# Patient Record
Sex: Male | Born: 1960 | Race: Black or African American | Hispanic: No | Marital: Single | Smoking: Current every day smoker
Health system: Southern US, Community
[De-identification: ages and names within clinical notes are randomized; demographics above are authoritative.]

## PROBLEM LIST (undated history)

## (undated) ENCOUNTER — Emergency Department (HOSPITAL_COMMUNITY): Admission: EM | Payer: Self-pay | Source: Home / Self Care

## (undated) ENCOUNTER — Ambulatory Visit (HOSPITAL_COMMUNITY): Admission: EM

## (undated) DIAGNOSIS — K219 Gastro-esophageal reflux disease without esophagitis: Secondary | ICD-10-CM

## (undated) DIAGNOSIS — I10 Essential (primary) hypertension: Secondary | ICD-10-CM

## (undated) HISTORY — PX: HERNIA REPAIR: SHX51

## (undated) HISTORY — PX: TOTAL SHOULDER REPLACEMENT: SUR1217

---

## 2015-10-01 DIAGNOSIS — M75120 Complete rotator cuff tear or rupture of unspecified shoulder, not specified as traumatic: Secondary | ICD-10-CM | POA: Insufficient documentation

## 2015-10-01 DIAGNOSIS — M79602 Pain in left arm: Secondary | ICD-10-CM | POA: Insufficient documentation

## 2015-10-01 DIAGNOSIS — M19019 Primary osteoarthritis, unspecified shoulder: Secondary | ICD-10-CM | POA: Insufficient documentation

## 2019-07-28 ENCOUNTER — Encounter: Payer: Self-pay | Admitting: Internal Medicine

## 2019-07-28 DIAGNOSIS — E872 Acidosis: Secondary | ICD-10-CM | POA: Insufficient documentation

## 2019-07-28 DIAGNOSIS — R9431 Abnormal electrocardiogram [ECG] [EKG]: Secondary | ICD-10-CM | POA: Insufficient documentation

## 2019-07-28 DIAGNOSIS — Z79899 Other long term (current) drug therapy: Secondary | ICD-10-CM | POA: Insufficient documentation

## 2019-07-28 DIAGNOSIS — F1910 Other psychoactive substance abuse, uncomplicated: Secondary | ICD-10-CM | POA: Diagnosis present

## 2019-07-28 DIAGNOSIS — E7220 Disorder of urea cycle metabolism, unspecified: Secondary | ICD-10-CM | POA: Insufficient documentation

## 2019-07-28 DIAGNOSIS — F1410 Cocaine abuse, uncomplicated: Secondary | ICD-10-CM | POA: Insufficient documentation

## 2019-07-28 DIAGNOSIS — Z20822 Contact with and (suspected) exposure to covid-19: Secondary | ICD-10-CM | POA: Insufficient documentation

## 2019-07-28 DIAGNOSIS — M5030 Other cervical disc degeneration, unspecified cervical region: Secondary | ICD-10-CM | POA: Insufficient documentation

## 2019-07-28 DIAGNOSIS — N200 Calculus of kidney: Secondary | ICD-10-CM | POA: Insufficient documentation

## 2019-07-28 DIAGNOSIS — Y9302 Activity, running: Secondary | ICD-10-CM | POA: Insufficient documentation

## 2019-07-28 DIAGNOSIS — R079 Chest pain, unspecified: Secondary | ICD-10-CM | POA: Insufficient documentation

## 2019-07-28 DIAGNOSIS — I10 Essential (primary) hypertension: Secondary | ICD-10-CM | POA: Insufficient documentation

## 2019-07-28 DIAGNOSIS — S065X9A Traumatic subdural hemorrhage with loss of consciousness of unspecified duration, initial encounter: Secondary | ICD-10-CM | POA: Diagnosis present

## 2019-07-28 DIAGNOSIS — I6202 Nontraumatic subacute subdural hemorrhage: Secondary | ICD-10-CM | POA: Insufficient documentation

## 2019-07-28 DIAGNOSIS — G9340 Encephalopathy, unspecified: Secondary | ICD-10-CM | POA: Diagnosis present

## 2019-07-28 DIAGNOSIS — G92 Toxic encephalopathy: Secondary | ICD-10-CM | POA: Insufficient documentation

## 2019-07-28 DIAGNOSIS — I700 Atherosclerosis of aorta: Secondary | ICD-10-CM | POA: Diagnosis present

## 2019-07-28 DIAGNOSIS — F1010 Alcohol abuse, uncomplicated: Secondary | ICD-10-CM | POA: Insufficient documentation

## 2019-07-28 DIAGNOSIS — E860 Dehydration: Secondary | ICD-10-CM | POA: Insufficient documentation

## 2019-07-28 DIAGNOSIS — N281 Cyst of kidney, acquired: Secondary | ICD-10-CM | POA: Insufficient documentation

## 2019-08-10 DIAGNOSIS — M25562 Pain in left knee: Secondary | ICD-10-CM | POA: Insufficient documentation

## 2019-09-09 ENCOUNTER — Emergency Department (HOSPITAL_COMMUNITY): Payer: Medicaid Other

## 2019-09-09 ENCOUNTER — Other Ambulatory Visit: Payer: Self-pay

## 2019-09-09 ENCOUNTER — Emergency Department (HOSPITAL_COMMUNITY)
Admission: EM | Admit: 2019-09-09 | Discharge: 2019-09-09 | Disposition: A | Discharge status: Home / Self Care | Payer: Medicaid Other | Attending: Emergency Medicine | Admitting: Emergency Medicine

## 2019-09-09 ENCOUNTER — Encounter (HOSPITAL_COMMUNITY): Payer: Self-pay | Admitting: Emergency Medicine

## 2019-09-09 DIAGNOSIS — Y999 Unspecified external cause status: Secondary | ICD-10-CM | POA: Insufficient documentation

## 2019-09-09 DIAGNOSIS — S90412A Abrasion, left great toe, initial encounter: Secondary | ICD-10-CM

## 2019-09-09 DIAGNOSIS — Z23 Encounter for immunization: Secondary | ICD-10-CM | POA: Insufficient documentation

## 2019-09-09 DIAGNOSIS — S92414A Nondisplaced fracture of proximal phalanx of right great toe, initial encounter for closed fracture: Secondary | ICD-10-CM

## 2019-09-09 DIAGNOSIS — F17200 Nicotine dependence, unspecified, uncomplicated: Secondary | ICD-10-CM | POA: Insufficient documentation

## 2019-09-09 DIAGNOSIS — Y9389 Activity, other specified: Secondary | ICD-10-CM | POA: Insufficient documentation

## 2019-09-09 DIAGNOSIS — S90812A Abrasion, left foot, initial encounter: Secondary | ICD-10-CM | POA: Insufficient documentation

## 2019-09-09 DIAGNOSIS — R2241 Localized swelling, mass and lump, right lower limb: Secondary | ICD-10-CM | POA: Insufficient documentation

## 2019-09-09 DIAGNOSIS — S92152A Displaced avulsion fracture (chip fracture) of left talus, initial encounter for closed fracture: Secondary | ICD-10-CM

## 2019-09-09 DIAGNOSIS — Y929 Unspecified place or not applicable: Secondary | ICD-10-CM | POA: Insufficient documentation

## 2019-09-09 MED ORDER — TRAMADOL HCL 50 MG PO TABS
50.0000 mg | ORAL_TABLET | Freq: Once | ORAL | Status: AC
  Administered 2019-09-09: 50 mg via ORAL
  Filled 2019-09-09: qty 1

## 2019-09-09 MED ORDER — TRAMADOL HCL 50 MG PO TABS: 50.0000 mg | ORAL_TABLET | Freq: Four times a day (QID) | ORAL | 0 refills | Status: DC | PRN

## 2019-09-09 MED ORDER — BACITRACIN ZINC 500 UNIT/GM EX OINT
TOPICAL_OINTMENT | Freq: Two times a day (BID) | CUTANEOUS | Status: DC
  Administered 2019-09-09: 1 via TOPICAL

## 2019-09-09 MED ORDER — TETANUS-DIPHTH-ACELL PERTUSSIS 5-2.5-18.5 LF-MCG/0.5 IM SUSP
0.5000 mL | Freq: Once | INTRAMUSCULAR | Status: AC
  Administered 2019-09-09: 0.5 mL via INTRAMUSCULAR
  Filled 2019-09-09: qty 0.5

## 2019-09-09 MED ORDER — CEPHALEXIN 500 MG PO CAPS: 1000.0000 mg | ORAL_CAPSULE | Freq: Two times a day (BID) | ORAL | 0 refills | Status: DC

## 2019-09-09 MED ORDER — CEPHALEXIN 250 MG PO CAPS
500.0000 mg | ORAL_CAPSULE | Freq: Once | ORAL | Status: AC
  Administered 2019-09-09: 500 mg via ORAL
  Filled 2019-09-09: qty 2
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
## 2019-09-09 NOTE — Discharge Instructions (Addendum)
It was our pleasure to provide your ER care today - we hope that you feel better.  Keep abrasions very clean, wash with warm water and soap 2x/day.  Take keflex (antibiotic) as prescribed.   May wear brace on left for comfort/support, and wear supportive, thick soled shoe to help support right toe pain/fracture.   Take acetaminophen or ibuprofen as need for pain. You may also take ultram as need for pain - no driving when taking.   Follow up with orthopedist in the next couple weeks - call office to arrange appointment.   Follow up with primary care doctor for blood pressure which is mildly high today.  Return to ER if worse, new symptoms, severe pain, infection of wound, or other concern.

## 2019-09-09 NOTE — ED Notes (Signed)
Wound care provided and instructions given to patient.

## 2019-09-09 NOTE — ED Notes (Signed)
Patient verbalizes understanding of discharge instructions. Opportunity for questioning and answers were provided. Armband removed by staff, pt discharged from ED and tranported to front lobby in wheelchair for ride home.
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
## 2019-10-05 ENCOUNTER — Other Ambulatory Visit: Payer: Self-pay | Admitting: Orthopedic Surgery

## 2019-10-05 DIAGNOSIS — M19011 Primary osteoarthritis, right shoulder: Secondary | ICD-10-CM

## 2020-01-09 ENCOUNTER — Other Ambulatory Visit: Payer: Self-pay

## 2020-01-24 ENCOUNTER — Other Ambulatory Visit: Payer: Self-pay

## 2020-03-10 ENCOUNTER — Encounter (HOSPITAL_COMMUNITY): Payer: Self-pay | Admitting: Emergency Medicine

## 2020-03-10 ENCOUNTER — Emergency Department (HOSPITAL_COMMUNITY)
Admission: EM | Admit: 2020-03-10 | Discharge: 2020-03-10 | Disposition: A | Discharge status: Home / Self Care | Payer: Medicaid Other | Attending: Emergency Medicine | Admitting: Emergency Medicine

## 2020-03-10 ENCOUNTER — Other Ambulatory Visit: Payer: Self-pay

## 2020-03-10 DIAGNOSIS — F17200 Nicotine dependence, unspecified, uncomplicated: Secondary | ICD-10-CM | POA: Insufficient documentation

## 2020-03-10 DIAGNOSIS — W268XXA Contact with other sharp object(s), not elsewhere classified, initial encounter: Secondary | ICD-10-CM | POA: Insufficient documentation

## 2020-03-10 DIAGNOSIS — Z79899 Other long term (current) drug therapy: Secondary | ICD-10-CM | POA: Insufficient documentation

## 2020-03-10 DIAGNOSIS — I10 Essential (primary) hypertension: Secondary | ICD-10-CM | POA: Insufficient documentation

## 2020-03-10 DIAGNOSIS — S41112A Laceration without foreign body of left upper arm, initial encounter: Secondary | ICD-10-CM | POA: Insufficient documentation

## 2020-03-10 MED ORDER — LIDOCAINE-EPINEPHRINE 1 %-1:100000 IJ SOLN
20.0000 mL | Freq: Once | INTRAMUSCULAR | Status: AC
  Administered 2020-03-10: 20 mL
  Filled 2020-03-10: qty 1

## 2020-03-10 MED ORDER — IBUPROFEN 400 MG PO TABS
600.0000 mg | ORAL_TABLET | Freq: Once | ORAL | Status: DC
  Filled 2020-03-10: qty 1

## 2020-03-10 NOTE — ED Notes (Signed)
Patient verbalizes understanding of discharge instructions. Opportunity for questioning and answers were provided. Armband removed by staff, pt discharged from ED ambulatory.   
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
## 2020-03-10 NOTE — Discharge Instructions (Addendum)
1. Medications: Tylenol or ibuprofen for pain, usual home medications  2. Treatment: ice for swelling, keep wound clean with warm soap and water and keep bandage dry, do not submerge in water for 24 hours  3. Follow Up: Please have primary care doctor or come to the emergency department in 10-14 days to have your stitches removed or sooner if you have concerns. Return to the emergency department for increased redness, drainage of pus from the wound   WOUND CARE  Keep area clean and dry for 24 hours. Do not remove bandage, if applied.  After 24 hours, remove bandage and wash wound gently with mild soap and warm water. Reapply a new bandage after cleaning wound, if directed.   Continue daily cleansing with soap and water until stitches/staples are removed.  Do not apply any ointments or creams to the wound while stitches/staples are in place, as this may cause delayed healing. Return if you experience any of the following signs of infection: Swelling, redness, pus drainage, streaking, fever >101.0 F  Return if you experience excessive bleeding that does not stop after 15-20 minutes of constant, firm pressure.

## 2020-03-20 ENCOUNTER — Ambulatory Visit: Payer: Self-pay

## 2020-03-20 ENCOUNTER — Ambulatory Visit (HOSPITAL_COMMUNITY)
Admission: EM | Admit: 2020-03-20 | Discharge: 2020-03-20 | Disposition: A | Discharge status: Home / Self Care | Payer: Medicaid Other | Attending: Emergency Medicine | Admitting: Emergency Medicine

## 2020-03-20 ENCOUNTER — Encounter (HOSPITAL_COMMUNITY): Payer: Self-pay

## 2020-03-20 DIAGNOSIS — Z20822 Contact with and (suspected) exposure to covid-19: Secondary | ICD-10-CM | POA: Diagnosis not present

## 2020-03-20 DIAGNOSIS — B349 Viral infection, unspecified: Secondary | ICD-10-CM | POA: Diagnosis not present

## 2020-03-20 DIAGNOSIS — Z79899 Other long term (current) drug therapy: Secondary | ICD-10-CM | POA: Diagnosis not present

## 2020-03-20 DIAGNOSIS — R0602 Shortness of breath: Secondary | ICD-10-CM | POA: Insufficient documentation

## 2020-03-20 DIAGNOSIS — K219 Gastro-esophageal reflux disease without esophagitis: Secondary | ICD-10-CM | POA: Insufficient documentation

## 2020-03-20 DIAGNOSIS — I10 Essential (primary) hypertension: Secondary | ICD-10-CM | POA: Diagnosis not present

## 2020-03-20 DIAGNOSIS — F17200 Nicotine dependence, unspecified, uncomplicated: Secondary | ICD-10-CM | POA: Diagnosis not present

## 2020-03-20 DIAGNOSIS — Z4802 Encounter for removal of sutures: Secondary | ICD-10-CM | POA: Insufficient documentation

## 2020-03-20 HISTORY — DX: Gastro-esophageal reflux disease without esophagitis: K21.9

## 2020-03-20 HISTORY — DX: Essential (primary) hypertension: I10

## 2020-03-20 NOTE — Discharge Instructions (Addendum)
Your COVID test is pending.  You should self quarantine until the test result is back.    Take Tylenol or ibuprofen as needed for fever or discomfort.  Rest and keep yourself hydrated.    Follow-up with your primary care provider if your symptoms are not improving.     

## 2020-03-20 NOTE — ED Triage Notes (Signed)
Pt c/o general malaise onset yesterday. C/o cough, diarrhea, SOB, congestion, runny nose with white sputum, body aches, chills onset today.   Denies fever, n/v, sore throat, ear pain. Bilateral lungs CTA.  Pt also request suture removal from left arm laceration repair. Pt states says he doesn't have any money for medications.  Usually gets meds from health dept. Has appt with at [facility] on February 19. Is taking his BP meds every other day to make them last until next appt and availability to get free Rx.

## 2020-03-20 NOTE — Telephone Encounter (Signed)
Patient called and says he's been having SOB that started today. He says he feels like he has a cold with nasal stuffiness, body aches, chills. He says he feels it when he's up and moving around in the house, but still feels it some when sitting. He also says he has a slight cough, nothing that bad though. I asked about other COVID symptoms, he denies. Advised due to SOB to go to the UC. He says he will have to find a ride. Advised to wear a mask and the driver wear a mask and if unable to find a way to go on the [facility] website to do a virtual visit. Patient verbalized understanding.  Reason for Disposition . [1] MILD difficulty breathing (e.g., minimal/no SOB at rest, SOB with walking, pulse <100) AND [2] NEW-onset or WORSE than normal  Answer Assessment - Initial Assessment Questions 1. RESPIRATORY STATUS: "Describe your breathing?" (e.g., wheezing, shortness of breath, unable to speak, severe coughing)      Shortness of breath 2. ONSET: "When did this breathing problem begin?"      Today 3. PATTERN "Does the difficult breathing come and go, or has it been constant since it started?"       Comes and goes 4. SEVERITY: "How bad is your breathing?" (e.g., mild, moderate, severe)    - MILD: No SOB at rest, mild SOB with walking, speaks normally in sentences, can lay down, no retractions, pulse < 100.    - MODERATE: SOB at rest, SOB with minimal exertion and prefers to sit, cannot lie down flat, speaks in phrases, mild retractions, audible wheezing, pulse 100-120.    - SEVERE: Very SOB at rest, speaks in single words, struggling to breathe, sitting hunched forward, retractions, pulse > 120      Moderate 5. RECURRENT SYMPTOM: "Have you had difficulty breathing before?" If Yes, ask: "When was the last time?" and "What happened that time?"      No 6. CARDIAC HISTORY: "Do you have any history of heart disease?" (e.g., heart attack, angina, bypass surgery, angioplasty)      No 7. LUNG HISTORY: "Do you  have any history of lung disease?"  (e.g., pulmonary embolus, asthma, emphysema)     No 8. CAUSE: "What do you think is causing the breathing problem?"      I don't know 9. OTHER SYMPTOMS: "Do you have any other symptoms? (e.g., dizziness, runny nose, cough, chest pain, fever)     Body aches, stuffy nose, slight cough 10. PREGNANCY: "Is there any chance you are pregnant?" "When was your last menstrual period?"       N/A 11. TRAVEL: "Have you traveled out of the country in the last month?" (e.g., travel history, exposures)       No  Protocols used: BREATHING DIFFICULTY-A-AH

## 2020-03-20 NOTE — ED Provider Notes (Signed)
[facility]    CSN: [id number] Arrival date & time: 03/20/20  1857      History   Chief Complaint Chief Complaint  Patient presents with  . Shortness of Breath  . Fatigue    HPI Sean Gross is a 60 y.o. male.   Patient presents with fatigue, body aches, chills, congestion, runny nose, cough, shortness of breath, diarrhea since yesterday.  He denies fever, rash, vomiting, or other symptoms.  No treatments attempted at home.  Patient also request removal of sutures from his left arm; laceration occurred on 03/10/2020.  His medical history includes hypertension and GERD.     The history is provided by the patient and medical records.    Past Medical History:  Diagnosis Date  . GERD (gastroesophageal reflux disease)   . Hypertension     There are no problems to display for this patient.   History reviewed. No pertinent surgical history.     Home Medications    Prior to Admission medications   Medication Sig Start Date End Date Taking? Authorizing Provider  amLODipine (NORVASC) 10 MG tablet Take 10 mg by mouth daily. 02/08/20  Yes [provider]  omeprazole (PRILOSEC) 20 MG capsule Take 20 mg by mouth daily.   Yes [provider]  pravastatin (PRAVACHOL) 40 MG tablet Take 40 mg by mouth daily. 02/08/20  Yes [provider]  acetaminophen (TYLENOL) 500 MG tablet Take 500 mg by mouth every 6 (six) hours as needed for moderate pain.    [provider]    Family History History reviewed. No pertinent family history.  Social History Social History   Tobacco Use  . Smoking status: Current Every Day Smoker  . Smokeless tobacco: Never Used  Substance Use Topics  . Alcohol use: Yes  . Drug use: Not Currently     Allergies   Patient has no known allergies.   Review of Systems Review of Systems  Constitutional: Positive for chills and fatigue. Negative for fever.  HENT: Positive for congestion and rhinorrhea.  Negative for ear pain and sore throat.   Eyes: Negative for pain and visual disturbance.  Respiratory: Positive for cough and shortness of breath.   Cardiovascular: Negative for chest pain and palpitations.  Gastrointestinal: Positive for diarrhea. Negative for abdominal pain and vomiting.  Genitourinary: Negative for dysuria and hematuria.  Musculoskeletal: Negative for arthralgias and back pain.  Skin: Positive for wound. Negative for color change.  Neurological: Negative for seizures and syncope.  All other systems reviewed and are negative.    Physical Exam Triage Vital Signs ED Triage Vitals  Enc Vitals Group     BP      Pulse      Resp      Temp      Temp src      SpO2      Weight      Height      Head Circumference      Peak Flow      Pain Score      Pain Loc      Pain Edu?      Excl. in GC?    No data found.  Updated Vital Signs BP (!) 142/80 (BP Location: Right Arm)   Pulse 90   Temp 98.2 F (36.8 C) (Oral)   Resp 18   Ht 6\' 2"  (1.88 m)   Wt 157 lb (71.2 kg)   SpO2 99%   BMI 20.16 kg/m  Visual Acuity Right Eye Distance:   Left Eye Distance:   Bilateral Distance:    Right Eye Near:   Left Eye Near:    Bilateral Near:     Physical Exam Vitals and nursing note reviewed.  Constitutional:      General: He is not in acute distress.    Appearance: He is well-developed and well-nourished. He is not ill-appearing.  HENT:     Head: Normocephalic and atraumatic.     Right Ear: Tympanic membrane normal.     Left Ear: Tympanic membrane normal.     Nose: Nose normal.     Mouth/Throat:     Mouth: Mucous membranes are moist.     Pharynx: Oropharynx is clear.  Eyes:     Conjunctiva/sclera: Conjunctivae normal.  Cardiovascular:     Rate and Rhythm: Normal rate and regular rhythm.     Heart sounds: Normal heart sounds.  Pulmonary:     Effort: Pulmonary effort is normal. No respiratory distress.     Breath sounds: Normal breath sounds.  Abdominal:      Palpations: Abdomen is soft.     Tenderness: There is no abdominal tenderness. There is no guarding or rebound.  Musculoskeletal:        General: No edema.     Cervical back: Neck supple.  Skin:    General: Skin is warm and dry.     Comments: Well-healed laceration on left upper arm.  5 sutures removed.  Neurological:     General: No focal deficit present.     Mental Status: He is alert and oriented to person, place, and time.     Gait: Gait normal.  Psychiatric:        Mood and Affect: Mood and affect and mood normal.        Behavior: Behavior normal.      UC Treatments / Results  Labs (all labs ordered are listed, but only abnormal results are displayed) Labs Reviewed  SARS CORONAVIRUS 2 (TAT 6-24 HRS)    EKG   Radiology No results found.  Procedures Procedures (including critical care time)  Medications Ordered in UC Medications - No data to display  Initial Impression / Assessment and Plan / UC Course  I have reviewed the triage vital signs and the nursing notes.  Pertinent labs & imaging results that were available during my care of the patient were reviewed by me and considered in my medical decision making (see chart for details).   Viral illness, visit for suture removal. 5 sutures removed from left upper arm. PCR COVID pending.  Instructed patient to self quarantine until the test results are back.  Discussed symptomatic treatment including Tylenol, rest, hydration.  Instructed patient to follow up with PCP if his symptoms are not improving.  Patient agrees to plan of care.    Final Clinical Impressions(s) / UC Diagnoses   Final diagnoses:  Viral illness  Visit for suture removal     Discharge Instructions     Your COVID test is pending.  You should self quarantine until the test result is back.    Take Tylenol or ibuprofen as needed for fever or discomfort.  Rest and keep yourself hydrated.    Follow-up with your primary care provider if your  symptoms are not improving.        ED Prescriptions    None     PDMP not reviewed this encounter.   Mickie Bail, NP 03/20/20 2030

## 2020-03-21 LAB — SARS CORONAVIRUS 2 (TAT 6-24 HRS): SARS Coronavirus 2: NEGATIVE

## 2020-08-20 IMAGING — DX DG FOOT COMPLETE 3+V*R*
3 series · 3 of 3 positions shown · non-contrast
Comparison: None.

CLINICAL DATA: Posttraumatic right big toe pain

EXAM:
RIGHT FOOT COMPLETE - 3+ VIEW

[x foot ap right]
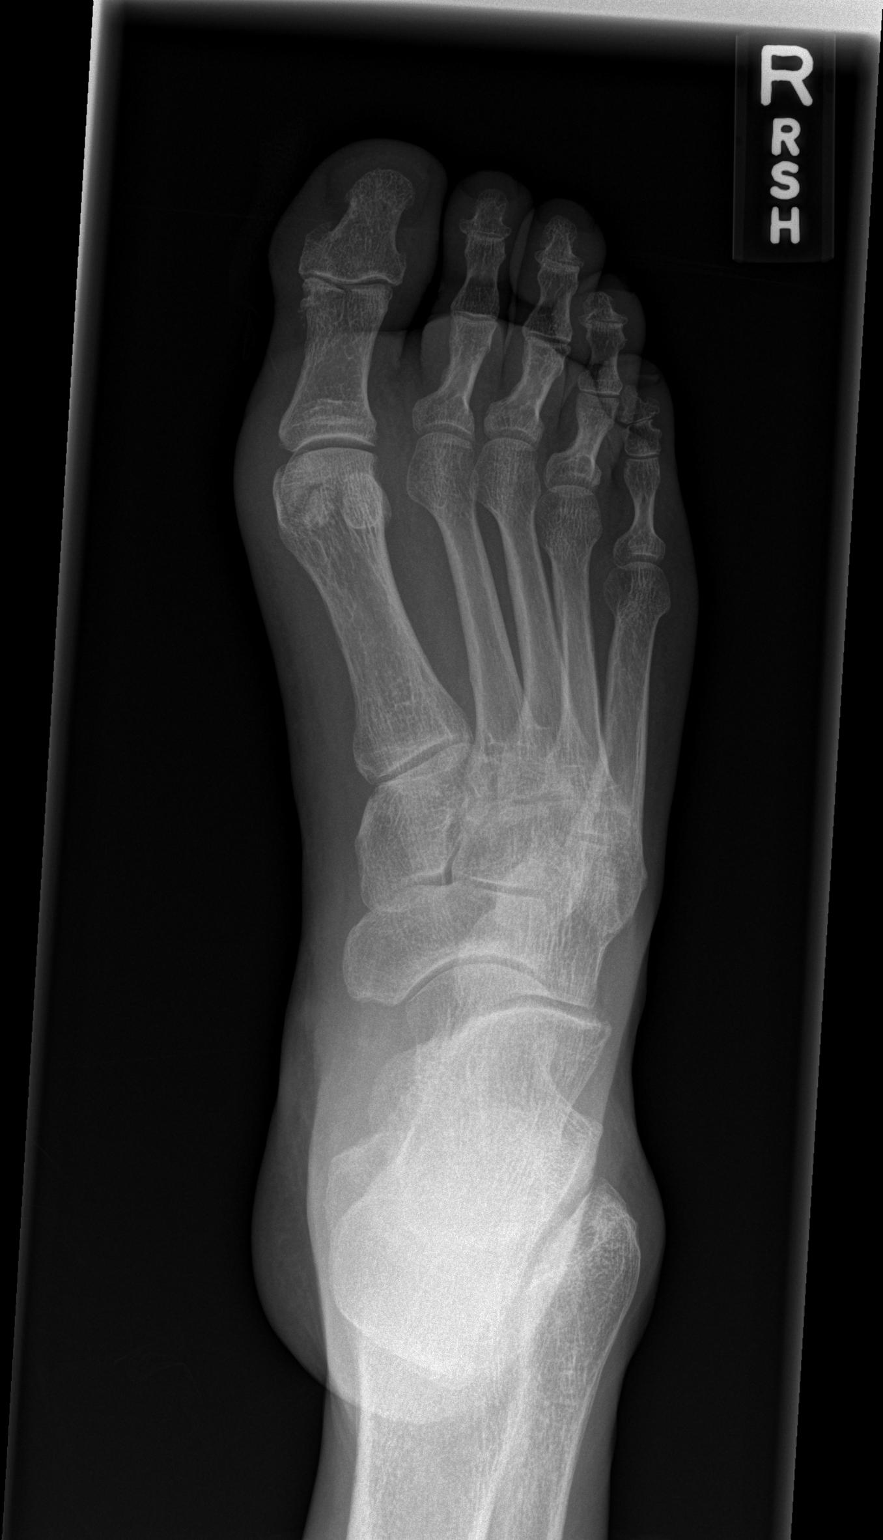

[x foot obl right]
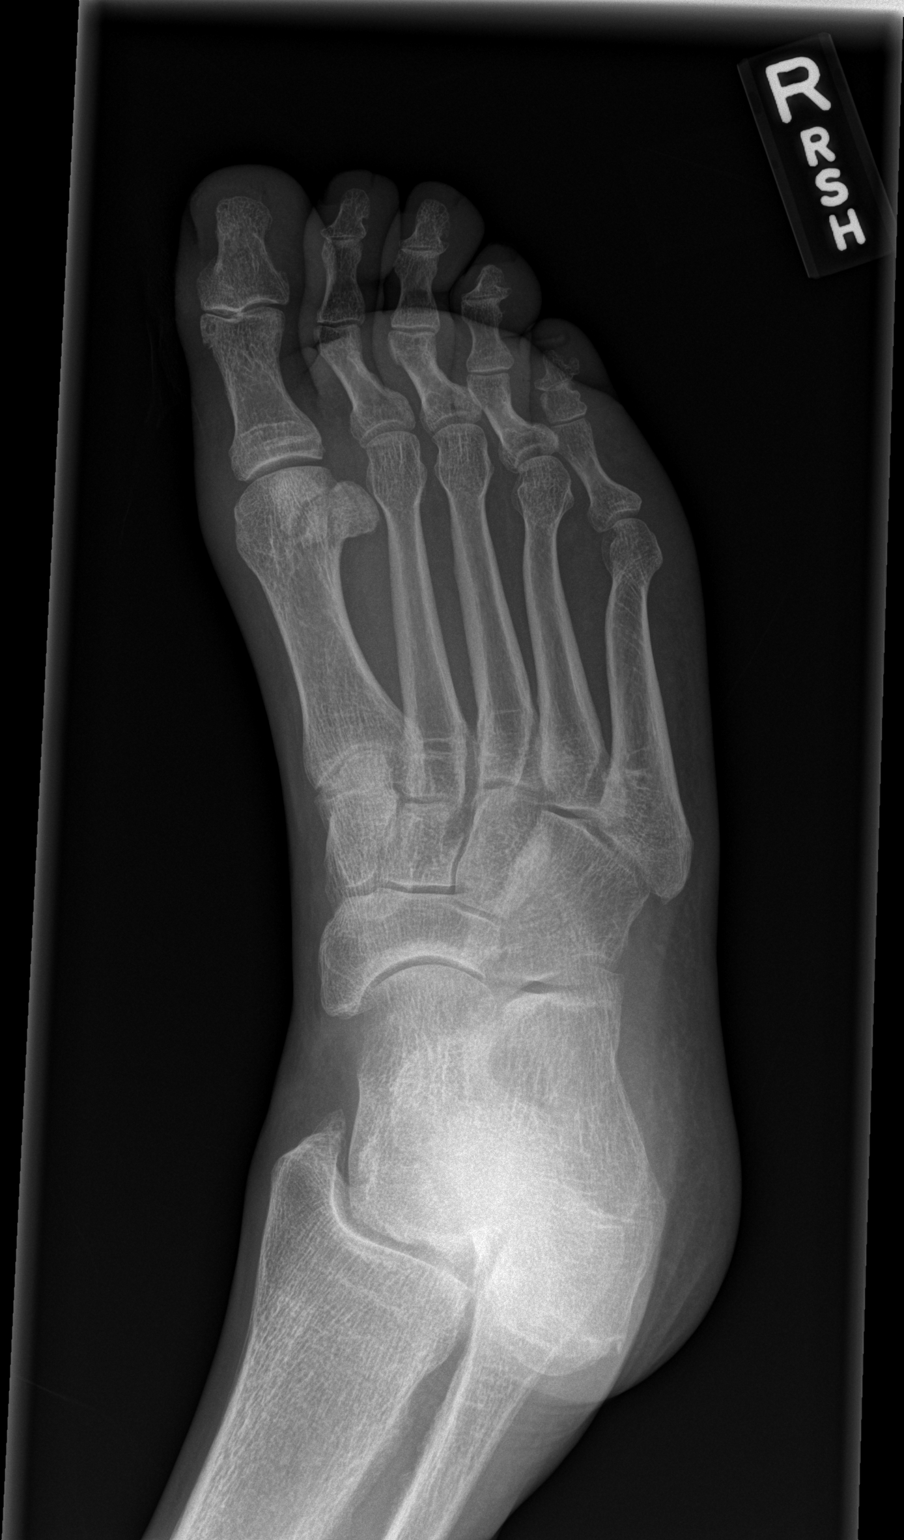

[x foot lat right]
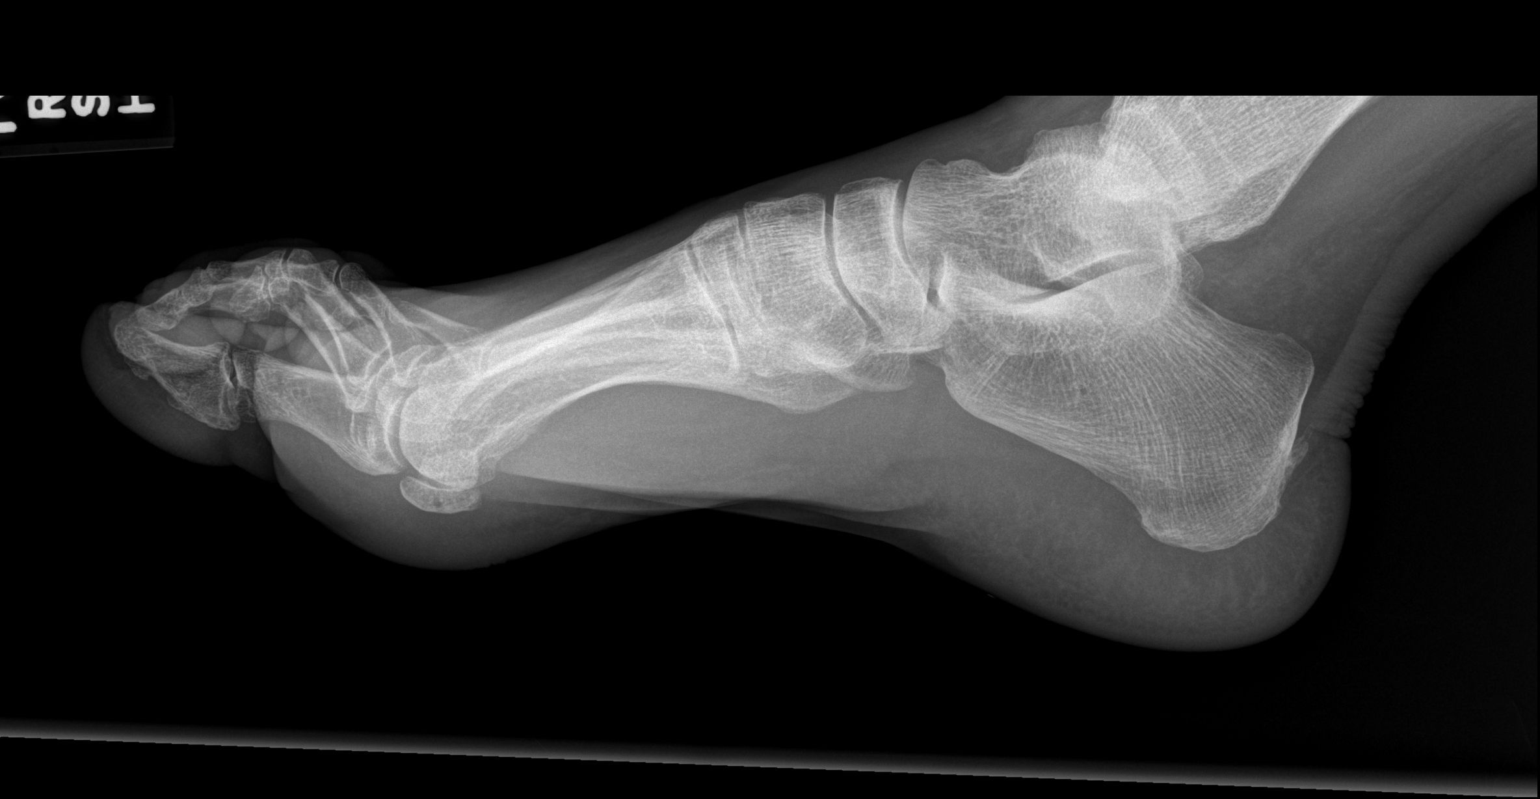

[3 of 3 positions shown; findings below may reference images not displayed]

FINDINGS: On the frontal view there is an oblique fracture traversing the head
of the first proximal phalanx, extending from the interphalangeal
joint medially. No dislocation. No opaque foreign body.

Cortical irregularity at the third proximal phalanx head on the
oblique view is most likely spurring based on the other views.

Corticated and chronic appearing fragmentation of the medial great
toe sesamoid.
IMPRESSION: Nondisplaced fracture of the first proximal phalanx.
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
## 2020-10-01 ENCOUNTER — Other Ambulatory Visit: Payer: Self-pay | Admitting: Orthopedic Surgery

## 2020-10-01 DIAGNOSIS — M19011 Primary osteoarthritis, right shoulder: Secondary | ICD-10-CM

## 2020-11-19 DIAGNOSIS — Z0389 Encounter for observation for other suspected diseases and conditions ruled out: Secondary | ICD-10-CM | POA: Diagnosis not present

## 2020-11-19 DIAGNOSIS — Z1388 Encounter for screening for disorder due to exposure to contaminants: Secondary | ICD-10-CM | POA: Diagnosis not present

## 2020-11-19 DIAGNOSIS — Z3009 Encounter for other general counseling and advice on contraception: Secondary | ICD-10-CM | POA: Diagnosis not present

## 2020-12-27 ENCOUNTER — Other Ambulatory Visit: Payer: Self-pay

## 2020-12-27 ENCOUNTER — Ambulatory Visit (HOSPITAL_COMMUNITY)
Admission: EM | Admit: 2020-12-27 | Discharge: 2020-12-27 | Disposition: A | Discharge status: Home / Self Care | Payer: Medicaid Other | Attending: Family Medicine | Admitting: Family Medicine

## 2020-12-27 ENCOUNTER — Encounter (HOSPITAL_COMMUNITY): Payer: Self-pay | Admitting: Emergency Medicine

## 2020-12-27 DIAGNOSIS — R0602 Shortness of breath: Secondary | ICD-10-CM | POA: Insufficient documentation

## 2020-12-27 DIAGNOSIS — B349 Viral infection, unspecified: Secondary | ICD-10-CM | POA: Insufficient documentation

## 2020-12-27 DIAGNOSIS — Z20822 Contact with and (suspected) exposure to covid-19: Secondary | ICD-10-CM | POA: Insufficient documentation

## 2020-12-27 LAB — POC INFLUENZA A AND B ANTIGEN (URGENT CARE ONLY)
INFLUENZA A ANTIGEN, POC: NEGATIVE
INFLUENZA B ANTIGEN, POC: NEGATIVE

## 2020-12-27 MED ORDER — PROMETHAZINE-DM 6.25-15 MG/5ML PO SYRP: 5.0000 mL | ORAL_SOLUTION | Freq: Four times a day (QID) | ORAL | 0 refills | Status: DC | PRN

## 2020-12-27 MED ORDER — ALBUTEROL SULFATE HFA 108 (90 BASE) MCG/ACT IN AERS
2.0000 | INHALATION_SPRAY | Freq: Once | RESPIRATORY_TRACT | Status: AC
  Administered 2020-12-27: 17:00:00 2 via RESPIRATORY_TRACT

## 2020-12-27 MED ORDER — PREDNISONE 20 MG PO TABS: 40.0000 mg | ORAL_TABLET | Freq: Every day | ORAL | 0 refills | Status: DC

## 2020-12-27 MED ORDER — ALBUTEROL SULFATE HFA 108 (90 BASE) MCG/ACT IN AERS
INHALATION_SPRAY | RESPIRATORY_TRACT | Status: AC
  Filled 2020-12-27: qty 6.7

## 2020-12-27 NOTE — ED Triage Notes (Signed)
Pt is present today with SOB, fever, and HA. Pt sx started yesterday morning.

## 2020-12-27 NOTE — ED Provider Notes (Signed)
[facility]    CSN: [id number] Arrival date & time: 12/27/20  1417      History   Chief Complaint Chief Complaint  Patient presents with   Shortness of Breath   Fever   Headache    HPI Sean Gross is a 60 y.o. male.   HPI Patient presents today with acute onset shortness of breath, fever, headache  Which developed x1 day ago.  Patient has no history of any underlying respiratory disease however is a current smoker.  He reports the shortness of breath worsened overnight although he is not  wheezing.  Past Medical History:  Diagnosis Date   GERD (gastroesophageal reflux disease)    Hypertension     There are no problems to display for this patient.   History reviewed. No pertinent surgical history.     Home Medications    Prior to Admission medications   Medication Sig Start Date End Date Taking? Authorizing Provider  predniSONE (DELTASONE) 20 MG tablet Take 2 tablets (40 mg total) by mouth daily with breakfast. 12/27/20  Yes Bing Neighbors, FNP  promethazine-dextromethorphan (PROMETHAZINE-DM) 6.25-15 MG/5ML syrup Take 5 mLs by mouth 4 (four) times daily as needed for cough. 12/27/20  Yes Bing Neighbors, FNP  acetaminophen (TYLENOL) 500 MG tablet Take 500 mg by mouth every 6 (six) hours as needed for moderate pain.    [provider]  amLODipine (NORVASC) 10 MG tablet Take 10 mg by mouth daily. 02/08/20   [provider]  omeprazole (PRILOSEC) 20 MG capsule Take 20 mg by mouth daily.    [provider]  pravastatin (PRAVACHOL) 40 MG tablet Take 40 mg by mouth daily. 02/08/20   [provider]    Family History History reviewed. No pertinent family history.  Social History Social History   Tobacco Use   Smoking status: Every Day   Smokeless tobacco: Never  Substance Use Topics   Alcohol use: Yes   Drug use: Not Currently     Allergies   Patient has no known allergies.   Review of  Systems Review of Systems Pertinent negatives listed in HPI   Physical Exam Triage Vital Signs ED Triage Vitals  Enc Vitals Group     BP 12/27/20 1541 (!) 162/80     Pulse Rate 12/27/20 1541 92     Resp 12/27/20 1541 18     Temp 12/27/20 1541 99.4 F (37.4 C)     Temp src --      SpO2 12/27/20 1541 100 %     Weight --      Height --      Head Circumference --      Peak Flow --      Pain Score 12/27/20 1542 8     Pain Loc --      Pain Edu? --      Excl. in GC? --    No data found.  Updated Vital Signs BP (!) 162/80   Pulse 92   Temp 99.4 F (37.4 C)   Resp 18   SpO2 100%   Visual Acuity Right Eye Distance:   Left Eye Distance:   Bilateral Distance:    Right Eye Near:   Left Eye Near:    Bilateral Near:     Physical Exam  General appearance: Alert, Ill-appearing, no distress Head: Normocephalic, without obvious abnormality, atraumatic ENT: TM normal, nares with rhinorrhea,  oropharynx w/o exudate Respiratory: Respirations even , unlabored, coarse lung sound  Heart: rate and rhythm normal. No gallop or murmurs noted on exam  Extremities: No gross deformities Skin: Skin color, texture, turgor normal. No rashes seen  Psych: Appropriate mood and affect. Neurologic: No focal neurological abnormality UC Treatments / Results  Labs (all labs ordered are listed, but only abnormal results are displayed) Labs Reviewed  SARS CORONAVIRUS 2 (TAT 6-24 HRS)  POC INFLUENZA A AND B ANTIGEN (URGENT CARE ONLY)  POC INFLUENZA A AND B ANTIGEN (URGENT CARE ONLY)    EKG   Radiology No results found.  Procedures Procedures (including critical care time)  Medications Ordered in UC Medications  albuterol (VENTOLIN HFA) 108 (90 Base) MCG/ACT inhaler 2 puff (2 puffs Inhalation Given 12/27/20 1634)    Initial Impression / Assessment and Plan / UC Course  I have reviewed the triage vital signs and the nursing notes.  Pertinent labs & imaging results that were available  during my care of the patient were reviewed by me and considered in my medical decision making (see chart for details).    Viral illness and shortness of breath.  Rapid flu was negative.  COVID test is pending. Patient has audible coarse lung sounds with some mild rhonchi therefore treating with prednisone, albuterol inhaler 2 puffs every 4-6 hours, improve Promethazine DM for cough.  Continue acetaminophen or Motrin for management of fever.  Return precautions given if any of his symptoms worsen. Final Clinical Impressions(s) / UC Diagnoses   Final diagnoses:  Shortness of breath  Viral illness     Discharge Instructions      COVID test is pending and will result within 24 hours. Your flu test was negative.  Start prednisone to improve work of breathing and shortness of breath.  Use inhaler 2 puffs every 4-6 hours as needed for shortness of breath and/or wheezing.  Promethazine DM for cough up to 4 times daily as needed.  Given your fever still recommend quarantining for the next 24 to 48 hours until you are COVID results are returned. Continue to alternate Tylenol and ibuprofen for management of fever. Force fluids to maintain hydration. Tamiflu twice daily for the next 5 days to reduce symptoms and course of influenza virus.  If you develop any severe shortness of breath, wheezing or difficulty breathing go immediately to the nearest emergency department.     ED Prescriptions     Medication Sig Dispense Auth. Provider   predniSONE (DELTASONE) 20 MG tablet Take 2 tablets (40 mg total) by mouth daily with breakfast. 10 tablet Bing Neighbors, FNP   promethazine-dextromethorphan (PROMETHAZINE-DM) 6.25-15 MG/5ML syrup Take 5 mLs by mouth 4 (four) times daily as needed for cough. 118 mL Bing Neighbors, FNP      PDMP not reviewed this encounter.   Bing Neighbors, FNP 12/27/20 [phone]

## 2020-12-27 NOTE — Discharge Instructions (Addendum)
COVID test is pending and will result within 24 hours. Your flu test was negative.  Start prednisone to improve work of breathing and shortness of breath.  Use inhaler 2 puffs every 4-6 hours as needed for shortness of breath and/or wheezing.  Promethazine DM for cough up to 4 times daily as needed.  Given your fever still recommend quarantining for the next 24 to 48 hours until you are COVID results are returned. Continue to alternate Tylenol and ibuprofen for management of fever. Force fluids to maintain hydration. Tamiflu twice daily for the next 5 days to reduce symptoms and course of influenza virus.  If you develop any severe shortness of breath, wheezing or difficulty breathing go immediately to the nearest emergency department.

## 2020-12-28 LAB — SARS CORONAVIRUS 2 (TAT 6-24 HRS): SARS Coronavirus 2: NEGATIVE

## 2021-05-14 DIAGNOSIS — Z1152 Encounter for screening for COVID-19: Secondary | ICD-10-CM | POA: Diagnosis not present

## 2021-05-22 DIAGNOSIS — Z1152 Encounter for screening for COVID-19: Secondary | ICD-10-CM | POA: Diagnosis not present

## 2021-05-27 DIAGNOSIS — Z1152 Encounter for screening for COVID-19: Secondary | ICD-10-CM | POA: Diagnosis not present

## 2021-05-29 DIAGNOSIS — Z1152 Encounter for screening for COVID-19: Secondary | ICD-10-CM | POA: Diagnosis not present

## 2021-06-05 DIAGNOSIS — Z1152 Encounter for screening for COVID-19: Secondary | ICD-10-CM | POA: Diagnosis not present

## 2021-06-12 DIAGNOSIS — Z1152 Encounter for screening for COVID-19: Secondary | ICD-10-CM | POA: Diagnosis not present

## 2021-06-19 DIAGNOSIS — Z1152 Encounter for screening for COVID-19: Secondary | ICD-10-CM | POA: Diagnosis not present

## 2023-12-02 ENCOUNTER — Other Ambulatory Visit: Payer: Self-pay

## 2023-12-02 ENCOUNTER — Telehealth: Admitting: Nurse Practitioner

## 2023-12-02 DIAGNOSIS — I10 Essential (primary) hypertension: Secondary | ICD-10-CM | POA: Diagnosis not present

## 2023-12-02 DIAGNOSIS — G8929 Other chronic pain: Secondary | ICD-10-CM | POA: Diagnosis not present

## 2023-12-02 DIAGNOSIS — M25511 Pain in right shoulder: Secondary | ICD-10-CM | POA: Diagnosis not present

## 2023-12-02 DIAGNOSIS — M25512 Pain in left shoulder: Secondary | ICD-10-CM | POA: Diagnosis not present

## 2023-12-02 MED ORDER — IBUPROFEN 800 MG PO TABS
800.0000 mg | ORAL_TABLET | Freq: Three times a day (TID) | ORAL | 0 refills | Status: DC | PRN
  Filled 2023-12-02: qty 30, 10d supply, fill #0

## 2023-12-02 MED ORDER — LISINOPRIL-HYDROCHLOROTHIAZIDE 10-12.5 MG PO TABS
1.0000 | ORAL_TABLET | Freq: Every day | ORAL | 0 refills | Status: DC
  Filled 2023-12-02: qty 30, 30d supply, fill #0

## 2023-12-02 MED ORDER — ACYCLOVIR 800 MG PO TABS
800.0000 mg | ORAL_TABLET | Freq: Every day | ORAL | 0 refills | Status: AC
  Filled 2023-12-02: qty 30, 30d supply, fill #0

## 2023-12-02 NOTE — Progress Notes (Unsigned)
 Acute Video Visit    Virtual Visit Consent:   Sean Gross, you are scheduled for a virtual visit with a [facility] provider today.     Just as with appointments in the office, your consent must be obtained to participate.  Your consent will be active for this visit and any virtual visit you may have with one of our providers in the next 365 days.     If you have a MyChart account, a copy of this consent can be sent to you electronically.  All virtual visits are billed to your insurance company just like a traditional visit in the office.    If the connection with a video visit is poor, the visit may have to be switched to a telephone visit.  With either a video or telephone visit, we are not always able to ensure that we have a secure connection.     I need to obtain your verbal consent now.   Are you willing to proceed with your visit today?    Sean Gross has provided verbal consent on 12/02/2023 for a virtual visit (video or telephone).   Sean Kitty, FNP  Date: 12/02/2023 11:32 AM  Subjective:     Patient ID: Sean Gross, male    DOB: [birth year], 63 y.o.   MRN: [id number]  Sean Gross, connected with  Sean Gross  ([id number], 1960-08-08) on 12/02/23 at  1:00 PM EDT by a video-enabled telemedicine application and verified that I am speaking with the correct person using two identifiers.   Location: Patient: [facility]  Provider: Virtual Visit Location Provider: Home Office   I discussed the limitations of evaluation and management by telemedicine and the availability of in person appointments. The patient expressed understanding and agreed to proceed.     HPI  Sean Gross is a 63 y.o. who identifies as a male who was assigned male at birth, and is being seen today for intake at [facility].  He was a patient in the past at the [facility] and was being cared for by Sean Gross, and is awaiting a new patient appointment next month at [facility].    Medical history is significant for HSV, pain, HTN, hyperlipidemia  He was lost to follow up recently due to incarceration/ was planning for shoulder surgery prior and had an MRI performed recently that he will attempt to get records of. He is interested in having a surgical workup again and restarting his care at the [facility]   He will be working with the social workers at [facility] for [employer] needs       Objective:     Physical Exam Constitutional:      General: He is not in acute distress.    Appearance: Normal appearance. He is not ill-appearing.  HENT:     Nose: Nose normal.  Pulmonary:     Effort: Pulmonary effort is normal.  Neurological:     Mental Status: He is alert and oriented to person, place, and time.  Psychiatric:        Mood and Affect: Mood normal.          Assessment & Plan:   1. Chronic pain of both shoulders (Primary)  - ibuprofen  (ADVIL ) 800 MG tablet; Take 1 tablet (800 mg total) by mouth every 8 (eight) hours as needed for moderate pain (pain score 4-6).  Dispense: 30 tablet; Refill: 0 - acyclovir (ZOVIRAX) 800 MG tablet; Take 1 tablet (  800 mg total) by mouth daily.  Dispense: 30 tablet; Refill: 0 - Ambulatory referral to Orthopedic Surgery  2. Benign essential HTN  - lisinopril-hydrochlorothiazide (ZESTORETIC) 10-12.5 MG tablet; Take 1 tablet by mouth daily.  Dispense: 30 tablet; Refill: 0   Follow Up Instructions: I discussed the assessment and treatment plan with the patient. The patient was provided an opportunity to ask questions and all were answered. The patient agreed with the plan and demonstrated an understanding of the instructions.  A copy of instructions were sent to the patient via MyChart unless otherwise noted below.    The patient was advised to call back or seek an in-person evaluation if the symptoms worsen or if the condition fails to improve as anticipated.    Sean Kitty, FNP  **Disclaimer: This note may have been dictated with  voice recognition software. Similar sounding words can inadvertently be transcribed and this note may contain transcription errors which may not have been corrected upon publication of note.**

## 2023-12-08 LAB — COLOGUARD: COLOGUARD: NEGATIVE

## 2023-12-22 ENCOUNTER — Other Ambulatory Visit: Payer: Self-pay

## 2023-12-22 ENCOUNTER — Other Ambulatory Visit: Payer: Self-pay | Admitting: Nurse Practitioner

## 2023-12-22 DIAGNOSIS — G8929 Other chronic pain: Secondary | ICD-10-CM

## 2023-12-22 MED ORDER — IBUPROFEN 800 MG PO TABS
800.0000 mg | ORAL_TABLET | Freq: Three times a day (TID) | ORAL | 0 refills | Status: DC | PRN
  Filled 2023-12-22: qty 30, 10d supply, fill #0

## 2023-12-23 ENCOUNTER — Ambulatory Visit: Admitting: Orthopedic Surgery

## 2023-12-31 ENCOUNTER — Other Ambulatory Visit: Payer: Self-pay

## 2024-01-04 ENCOUNTER — Other Ambulatory Visit: Payer: Self-pay | Admitting: Nurse Practitioner

## 2024-01-04 ENCOUNTER — Other Ambulatory Visit: Payer: Self-pay

## 2024-01-04 DIAGNOSIS — I10 Essential (primary) hypertension: Secondary | ICD-10-CM

## 2024-01-04 DIAGNOSIS — B009 Herpesviral infection, unspecified: Secondary | ICD-10-CM

## 2024-01-04 DIAGNOSIS — G8929 Other chronic pain: Secondary | ICD-10-CM

## 2024-01-04 MED ORDER — ACYCLOVIR 800 MG PO TABS
800.0000 mg | ORAL_TABLET | Freq: Every day | ORAL | 0 refills | Status: AC
  Filled 2024-01-04: qty 30, 30d supply, fill #0

## 2024-01-04 MED ORDER — LISINOPRIL-HYDROCHLOROTHIAZIDE 10-12.5 MG PO TABS
1.0000 | ORAL_TABLET | Freq: Every day | ORAL | 0 refills | Status: DC
  Filled 2024-01-04: qty 30, 30d supply, fill #0

## 2024-01-04 MED ORDER — IBUPROFEN 800 MG PO TABS
800.0000 mg | ORAL_TABLET | Freq: Three times a day (TID) | ORAL | 0 refills | Status: AC | PRN
  Filled 2024-01-04: qty 90, 30d supply, fill #0

## 2024-01-05 DIAGNOSIS — H5203 Hypermetropia, bilateral: Secondary | ICD-10-CM | POA: Diagnosis not present

## 2024-01-15 ENCOUNTER — Other Ambulatory Visit: Payer: Self-pay

## 2024-01-15 ENCOUNTER — Telehealth: Payer: Self-pay | Admitting: Orthopedic Surgery

## 2024-01-15 NOTE — Telephone Encounter (Signed)
 Pt wants to know if he can get a copy of his medical records when he comes to his appt on 1/5 at 3:30pm. Call back number is [phone].

## 2024-01-15 NOTE — Telephone Encounter (Signed)
 Called patient to let him know we do not process medical records request here but he could come into the office and sign authorization for the request to be processed--patient did not answer but LMVM for him advising in detail. Sending back to you so you are aware for future request.

## 2024-01-25 ENCOUNTER — Other Ambulatory Visit: Payer: Self-pay

## 2024-01-25 ENCOUNTER — Other Ambulatory Visit: Payer: Self-pay | Admitting: *Deleted

## 2024-01-25 DIAGNOSIS — N528 Other male erectile dysfunction: Secondary | ICD-10-CM

## 2024-01-25 MED ORDER — SILDENAFIL CITRATE 100 MG PO TABS
50.0000 mg | ORAL_TABLET | Freq: Every day | ORAL | 11 refills | Status: AC | PRN
  Filled 2024-01-25: qty 10, 10d supply, fill #0

## 2024-01-25 MED ORDER — SILDENAFIL CITRATE 100 MG PO TABS: 50.0000 mg | ORAL_TABLET | Freq: Every day | ORAL | 11 refills | Status: DC | PRN

## 2024-02-12 ENCOUNTER — Other Ambulatory Visit: Payer: Self-pay

## 2024-02-12 ENCOUNTER — Ambulatory Visit
Admission: EM | Admit: 2024-02-12 | Discharge: 2024-02-12 | Disposition: A | Discharge status: Home / Self Care | Payer: MEDICAID | Attending: Emergency Medicine | Admitting: Emergency Medicine

## 2024-02-12 ENCOUNTER — Encounter: Payer: Self-pay | Admitting: Emergency Medicine

## 2024-02-12 DIAGNOSIS — K219 Gastro-esophageal reflux disease without esophagitis: Secondary | ICD-10-CM

## 2024-02-12 MED ORDER — FAMOTIDINE 20 MG PO TABS: 20.0000 mg | ORAL_TABLET | Freq: Two times a day (BID) | ORAL | 0 refills | Status: DC

## 2024-02-12 MED ORDER — ALUM & MAG HYDROXIDE-SIMETH 400-400-40 MG/5ML PO SUSP: 10.0000 mL | Freq: Four times a day (QID) | ORAL | 0 refills | Status: AC | PRN

## 2024-02-12 MED ORDER — POLYETHYLENE GLYCOL 3350 17 G PO PACK: 17.0000 g | PACK | Freq: Every day | ORAL | 0 refills | Status: AC

## 2024-02-12 MED ORDER — OMEPRAZOLE 40 MG PO CPDR: 40.0000 mg | DELAYED_RELEASE_CAPSULE | Freq: Every day | ORAL | 0 refills | Status: DC

## 2024-02-12 MED ORDER — LIDOCAINE VISCOUS HCL 2 % MT SOLN
15.0000 mL | Freq: Once | OROMUCOSAL | Status: AC
  Administered 2024-02-12: 15 mL via OROMUCOSAL

## 2024-02-12 MED ORDER — ALUM & MAG HYDROXIDE-SIMETH 200-200-20 MG/5ML PO SUSP
30.0000 mL | Freq: Once | ORAL | Status: AC
  Administered 2024-02-12: 30 mL via ORAL

## 2024-02-12 NOTE — ED Triage Notes (Addendum)
 Pt hx acid reflux and takes omeprazole/Prilosec, bottle of Tums and water with baking soda. Reports having pain in epigastric area for 2 months. Reports when eats and drinks water and will vomit. Hx hernia before.

## 2024-02-12 NOTE — Discharge Instructions (Addendum)
 Take the omeprazole daily to help with symptoms of acid reflux/gastric ulcer.  Please follow-up with your primary care provider in the next few weeks for reevaluation.  If needed they can place a referral to GI for potential endoscopy and further evaluation.  Use the Mylanta every 6 hours as needed.  Avoid fried food or spicy foods.  I have attached some dietary guidelines.  Seek immediate care at the nearest emergency department for any severe pain, vomiting blood, or new concerning symptoms.

## 2024-02-12 NOTE — ED Provider Notes (Addendum)
 " [facility]    CSN: [id number] Arrival date & time: 02/12/24  1101      History   Chief Complaint Chief Complaint  Patient presents with   Gastroesophageal Reflux    HPI Sean Gross is a 64 y.o. male.   Patient presents to clinic over concern of severe epigastric pain and burning sensation for the past 2 weeks.  Has suffered with acid reflux for a while and has been taking Prilosec.  Has been taking this daily without any improvement.  Reports he is ate a whole bottle of Tums without improvement as well.  Has been having nausea and vomiting.  Has not really been able to eat.  Last night after he drank some cold water it came up immediately and he vomited up all the food from that day.  Did not notice any blood in emesis and has not had any blood in his stool.  He does not smoke or drink.  Patient reports he has been constipated and having a hard time clearing out his bowels.   The history is provided by the patient and medical records.  Gastroesophageal Reflux    Past Medical History:  Diagnosis Date   GERD (gastroesophageal reflux disease)    Hypertension     Patient Active Problem List   Diagnosis Date Noted   Pain in left knee 08/10/2019   Full thickness rotator cuff tear 10/01/2015   Localized, primary osteoarthritis of shoulder region 10/01/2015   Pain in left arm 10/01/2015    Past Surgical History:  Procedure Laterality Date   HERNIA REPAIR     TOTAL SHOULDER REPLACEMENT Left        Home Medications    Prior to Admission medications  Medication Sig Start Date End Date Taking? Authorizing Provider  omeprazole (PRILOSEC) 40 MG capsule Take 1 capsule (40 mg total) by mouth daily. 02/12/24  Yes Shaindy Reader  N, FNP  acetaminophen (TYLENOL) 500 MG tablet Take 500 mg by mouth every 6 (six) hours as needed for moderate pain.    [provider]  acyclovir  (ZOVIRAX ) 800 MG tablet Take 1 tablet (800 mg total) by mouth daily. 01/04/24    Kennyth Domino, FNP  amLODipine (NORVASC) 10 MG tablet Take 10 mg by mouth daily. 02/08/20   [provider]  ibuprofen  (ADVIL ) 800 MG tablet Take 1 tablet (800 mg total) by mouth every 8 (eight) hours as needed for moderate pain (pain score 4-6). 01/04/24   Kennyth Domino, FNP  lisinopril -hydrochlorothiazide  (ZESTORETIC ) 10-12.5 MG tablet Take 1 tablet by mouth daily. 01/04/24   Kennyth Domino, FNP  omeprazole (PRILOSEC) 20 MG capsule Take 20 mg by mouth daily.    [provider]  pravastatin (PRAVACHOL) 40 MG tablet Take 40 mg by mouth daily. 02/08/20   [provider]  sildenafil  (VIAGRA ) 100 MG tablet Take 0.5-1 tablets (50-100 mg total) by mouth daily as needed for erectile dysfunction. 01/25/24   Placey, Ronal Caldron, NP    Family History No family history on file.  Social History Social History[1]   Allergies   Patient has no known allergies.   Review of Systems Review of Systems  Per HPI  Physical Exam Triage Vital Signs ED Triage Vitals  Encounter Vitals Group     BP 02/12/24 1153 (!) 155/88     Girls Systolic BP Percentile --      Girls Diastolic BP Percentile --      Boys Systolic BP Percentile --  Boys Diastolic BP Percentile --      Pulse Rate 02/12/24 1153 63     Resp 02/12/24 1153 17     Temp 02/12/24 1153 98.1 F (36.7 C)     Temp Source 02/12/24 1153 Oral     SpO2 02/12/24 1153 99 %     Weight --      Height --      Head Circumference --      Peak Flow --      Pain Score 02/12/24 1149 10     Pain Loc --      Pain Education --      Exclude from Growth Chart --    No data found.  Updated Vital Signs BP (!) 155/88 (BP Location: Right Arm)   Pulse 63   Temp 98.1 F (36.7 C) (Oral)   Resp 17   SpO2 99%   Visual Acuity Right Eye Distance:   Left Eye Distance:   Bilateral Distance:    Right Eye Near:   Left Eye Near:    Bilateral Near:     Physical Exam Vitals and nursing note reviewed.  Constitutional:       Appearance: Normal appearance.  HENT:     Head: Normocephalic and atraumatic.     Right Ear: External ear normal.     Left Ear: External ear normal.     Nose: Nose normal.     Mouth/Throat:     Mouth: Mucous membranes are moist.  Eyes:     Conjunctiva/sclera: Conjunctivae normal.  Cardiovascular:     Rate and Rhythm: Normal rate and regular rhythm.     Heart sounds: Normal heart sounds. No murmur heard. Pulmonary:     Effort: Pulmonary effort is normal. No respiratory distress.     Breath sounds: Normal breath sounds. No wheezing.  Abdominal:     General: Abdomen is flat. Bowel sounds are normal.     Palpations: Abdomen is soft.     Tenderness: There is abdominal tenderness. There is no guarding or rebound.     Hernia: No hernia is present.  Musculoskeletal:        General: Normal range of motion.  Skin:    General: Skin is warm and dry.  Neurological:     General: No focal deficit present.     Mental Status: He is alert.  Psychiatric:        Mood and Affect: Mood normal.      UC Treatments / Results  Labs (all labs ordered are listed, but only abnormal results are displayed) Labs Reviewed - No data to display  EKG   Radiology No results found.  Procedures Procedures (including critical care time)  Medications Ordered in UC Medications  alum & mag hydroxide-simeth (MAALOX/MYLANTA) 200-200-20 MG/5ML suspension 30 mL (30 mLs Oral Given 02/12/24 1217)  lidocaine  (XYLOCAINE ) 2 % viscous mouth solution 15 mL (15 mLs Mouth/Throat Given 02/12/24 1217)    Initial Impression / Assessment and Plan / UC Course  I have reviewed the triage vital signs and the nursing notes.  Pertinent labs & imaging results that were available during my care of the patient were reviewed by me and considered in my medical decision making (see chart for details).  Vitals and triage reviewed, patient is hemodynamically stable.  Abdomen is soft with active bowel sounds.  Without guarding or  rebound.  Does have mild epigastric tenderness.  No obvious hernia.  EKG shows sinus bradycardia, without ST or ST depression.  GI cocktail helped improve symptoms, belching helps as well.  Suspect acid reflux versus ulcer.  Will trial 40 mg omeprazole daily and dietary modifications with PCP follow-up.  MiraLAX as needed for constipation.  Plan of care, follow-up care and strict emergency precautions given.  No questions at this time.     Final Clinical Impressions(s) / UC Diagnoses   Final diagnoses:  Gastroesophageal reflux disease, unspecified whether esophagitis present     Discharge Instructions      Take the omeprazole daily to help with symptoms of acid reflux/gastric ulcer.  Please follow-up with your primary care provider in the next few weeks for reevaluation.  If needed they can place a referral to GI for potential endoscopy and further evaluation.  Avoid fried food or spicy foods.  I have attached some dietary guidelines.  Seek immediate care at the nearest emergency department for any severe pain, vomiting blood, or new concerning symptoms.      ED Prescriptions     Medication Sig Dispense Auth. Provider   omeprazole (PRILOSEC) 40 MG capsule Take 1 capsule (40 mg total) by mouth daily. 30 capsule Dreama, Jericca Russett  N, FNP   famotidine (PEPCID) 20 MG tablet  (Status: Discontinued) Take 1 tablet (20 mg total) by mouth 2 (two) times daily. 60 tablet Dreama, Sadonna Kotara  N, FNP      PDMP not reviewed this encounter.    Dreama Terris SAILOR, FNP 02/12/24 1246     [1]  Social History Tobacco Use   Smoking status: Every Day   Smokeless tobacco: Never  Substance Use Topics   Alcohol use: Yes   Drug use: Not Currently     Dreama Gretchen SAILOR, FNP 02/12/24 1251  "

## 2024-02-15 ENCOUNTER — Ambulatory Visit: Payer: MEDICAID | Admitting: Orthopedic Surgery

## 2024-02-15 ENCOUNTER — Other Ambulatory Visit: Payer: MEDICAID

## 2024-02-15 ENCOUNTER — Encounter: Payer: Self-pay | Admitting: Orthopedic Surgery

## 2024-02-15 DIAGNOSIS — Z9889 Other specified postprocedural states: Secondary | ICD-10-CM

## 2024-02-15 DIAGNOSIS — M25511 Pain in right shoulder: Secondary | ICD-10-CM

## 2024-02-15 NOTE — Progress Notes (Signed)
 "  Office Visit Note   Patient: Sean Gross           Date of Birth: September 13, 1960           MRN: [id number] Visit Date: 02/15/2024 Requested by: Kennyth Domino, FNP [address] [place],  [place] [address] PCP: Scarlett Ronal Caldron, NP  Subjective: Chief Complaint  Patient presents with   Right Shoulder - Pain    HPI: Sean Gross is a 64 y.o. male who presents to the office reporting right shoulder pain.  Denies any history of injury but states he has had a long history of pain in that right shoulder.  Describes multiple dislocations when he was younger.  Reports chronic pain as well as diminished range of motion which limits his activity.  Really cannot lift any weight.  Describes difficulty with ADLs.  Has tried physical therapy without relief.  Currently living in a boardinghouse.  Was incarcerated but released in October.  Currently is working at [employer] about 3 hours a day 4 days a week.  He has a history of injections which have not helped.  This includes an ultrasound-guided injection in August.  Ibuprofen  helps minimally.  Did undergo left shoulder replacement in 2017 while he was incarcerated..                ROS: All systems reviewed are negative as they relate to the chief complaint within the history of present illness.  Patient denies fevers or chills.  Assessment & Plan: Visit Diagnoses:  1. Right shoulder pain, unspecified chronicity   2. 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  I think he needs shoulder replacement sometime in the future.  Right arm is very stiff.  He wants to get a little bit more grounded before tackling surgery for the right shoulder.  Will see him back in 6 months with consideration of injection at that time.  Conversely he may want to proceed with shoulder replacement.  Follow-Up Instructions: No follow-ups on file.    Orders:  Orders Placed This Encounter  Procedures   XR Shoulder Right   XR Shoulder Left   No orders of the defined types were placed in this encounter.     Procedures: No procedures performed   Clinical Data: No additional findings.  Objective: Vital Signs: There were no vitals taken for this visit.  Physical Exam:  Constitutional: Patient appears well-developed HEENT:  Head: Normocephalic Eyes:EOM are normal Neck: Normal range of motion Cardiovascular: Normal rate Pulmonary/chest: Effort normal Neurologic: Patient is alert Skin: Skin is warm Psychiatric: Patient has normal mood and affect  Ortho Exam: Ortho exam demonstrates range of motion on the right of 5/70/80.  Active forward flexion 85 active abduction 65.  Rotator cuff strength is pretty reasonable to external rotation and subscap testing at 15 degrees of abduction.  Deltoid fires.  Motor or sensory function in the hand is intact.  Left shoulder does have abduction and forward flexion above 90 degrees.  Rotator cuff strength intact on that side.  Specialty Comments:  No specialty comments available.  Imaging: No results found.   PMFS History: Patient Active Problem List   Diagnosis Date Noted   Pain in left knee 08/10/2019   Full thickness rotator cuff tear 10/01/2015   Localized, primary osteoarthritis of shoulder region 10/01/2015   Pain in  left arm 10/01/2015   Past Medical History:  Diagnosis Date   GERD (gastroesophageal reflux disease)    Hypertension     History reviewed. No pertinent family history.  Past Surgical History:  Procedure Laterality Date   HERNIA REPAIR     TOTAL SHOULDER REPLACEMENT Left    Social History   Occupational History   Not on file  Tobacco Use   Smoking status: Every Day   Smokeless tobacco: Never  Substance and Sexual Activity   Alcohol use: Yes   Drug use: Not Currently   Sexual activity: Not on file        "

## 2024-02-23 ENCOUNTER — Other Ambulatory Visit: Payer: Self-pay | Admitting: *Deleted

## 2024-02-23 ENCOUNTER — Other Ambulatory Visit (HOSPITAL_COMMUNITY): Payer: Self-pay

## 2024-02-23 ENCOUNTER — Other Ambulatory Visit: Payer: Self-pay

## 2024-02-23 DIAGNOSIS — K219 Gastro-esophageal reflux disease without esophagitis: Secondary | ICD-10-CM

## 2024-02-23 DIAGNOSIS — I10 Essential (primary) hypertension: Secondary | ICD-10-CM

## 2024-02-23 MED ORDER — AMLODIPINE BESYLATE 10 MG PO TABS
10.0000 mg | ORAL_TABLET | Freq: Every day | ORAL | 0 refills | Status: AC
  Filled 2024-02-23: qty 30, 30d supply, fill #0

## 2024-02-23 MED ORDER — OMEPRAZOLE 40 MG PO CPDR
40.0000 mg | DELAYED_RELEASE_CAPSULE | Freq: Every day | ORAL | 0 refills | Status: AC
  Filled 2024-02-23 – 2024-03-07 (×2): qty 30, 30d supply, fill #0

## 2024-02-23 MED ORDER — LISINOPRIL-HYDROCHLOROTHIAZIDE 10-12.5 MG PO TABS
1.0000 | ORAL_TABLET | Freq: Every day | ORAL | 0 refills | Status: AC
  Filled 2024-02-23: qty 30, 30d supply, fill #0

## 2024-02-23 MED ORDER — FAMOTIDINE 20 MG PO TABS
20.0000 mg | ORAL_TABLET | Freq: Two times a day (BID) | ORAL | 0 refills | Status: AC
  Filled 2024-02-23 – 2024-03-07 (×2): qty 60, 30d supply, fill #0

## 2024-02-23 NOTE — Progress Notes (Signed)
 He asked to see a GI doctor d/t GERD.  I called to discuss.

## 2024-02-23 NOTE — Progress Notes (Signed)
 GI symptoms under better control with Pepcid  and Omeprazole   Continue for now.  Adhere GERD diet.  Call for GI red flags. Some constipation.  Cont stool softener, fluid and fiber Needs BP med refill too

## 2024-03-04 NOTE — Progress Notes (Signed)
 The patient presented for a video visit on 12/02/23 to. Blood pressure screening wasn't conducted. During the appointment, the patient did not report (SDOH).   A review of the patient's chart revealed that they do currently have a primary care provider (PCP). The pcp visits aren't visible in chl. However There is are orders only encounters visible in chl dated 02/23/24. There is a future specialty appointment indicated on 08/15/24. At this time, no additional support from the [facility] team is necessary.

## 2024-03-07 ENCOUNTER — Other Ambulatory Visit: Payer: Self-pay

## 2024-03-16 ENCOUNTER — Other Ambulatory Visit: Payer: Self-pay

## 2024-08-15 ENCOUNTER — Ambulatory Visit: Payer: MEDICAID | Admitting: Orthopedic Surgery
